# Patient Record
Sex: Male | Born: 1935 | Race: White | Hispanic: No | Marital: Married
Health system: Southern US, Community
[De-identification: ages and names within clinical notes are randomized; demographics above are authoritative.]

---

## 2003-09-14 ENCOUNTER — Other Ambulatory Visit: Payer: Self-pay

## 2013-09-11 ENCOUNTER — Ambulatory Visit: Payer: Self-pay | Admitting: Family Medicine

## 2013-09-11 LAB — CREATININE, SERUM
CREATININE: 1.45 mg/dL — AB (ref 0.60–1.30)
EGFR (Non-African Amer.): 45 — ABNORMAL LOW
GFR CALC AF AMER: 52 — AB

## 2013-09-13 ENCOUNTER — Ambulatory Visit: Payer: Self-pay | Admitting: Oncology

## 2013-09-19 ENCOUNTER — Ambulatory Visit: Payer: Self-pay | Admitting: Oncology

## 2013-09-19 LAB — PROTIME-INR
INR: 1.3
Prothrombin Time: 15.6 secs — ABNORMAL HIGH (ref 11.5–14.7)

## 2013-09-23 LAB — BODY FLUID CULTURE

## 2013-10-03 ENCOUNTER — Ambulatory Visit: Payer: Self-pay | Admitting: Oncology

## 2013-10-16 ENCOUNTER — Ambulatory Visit: Payer: Self-pay | Admitting: Oncology

## 2013-10-24 LAB — CBC CANCER CENTER
BASOS PCT: 0.4 %
Basophil #: 0.1 x10 3/mm (ref 0.0–0.1)
EOS ABS: 0.2 x10 3/mm (ref 0.0–0.7)
Eosinophil %: 1.7 %
HCT: 43.5 % (ref 40.0–52.0)
HGB: 13.8 g/dL (ref 13.0–18.0)
LYMPHS ABS: 1.9 x10 3/mm (ref 1.0–3.6)
Lymphocyte %: 14 %
MCH: 30.9 pg (ref 26.0–34.0)
MCHC: 31.7 g/dL — AB (ref 32.0–36.0)
MCV: 97 fL (ref 80–100)
MONO ABS: 1.2 x10 3/mm — AB (ref 0.2–1.0)
Monocyte %: 8.3 %
Neutrophil #: 10.5 x10 3/mm — ABNORMAL HIGH (ref 1.4–6.5)
Neutrophil %: 75.6 %
Platelet: 361 x10 3/mm (ref 150–440)
RBC: 4.46 10*6/uL (ref 4.40–5.90)
RDW: 16.1 % — ABNORMAL HIGH (ref 11.5–14.5)
WBC: 13.9 x10 3/mm — ABNORMAL HIGH (ref 3.8–10.6)

## 2013-10-24 LAB — COMPREHENSIVE METABOLIC PANEL
ALBUMIN: 2.3 g/dL — AB (ref 3.4–5.0)
ANION GAP: 11 (ref 7–16)
Alkaline Phosphatase: 325 U/L — ABNORMAL HIGH
BILIRUBIN TOTAL: 0.9 mg/dL (ref 0.2–1.0)
BUN: 31 mg/dL — AB (ref 7–18)
CO2: 24 mmol/L (ref 21–32)
CREATININE: 1.29 mg/dL (ref 0.60–1.30)
Calcium, Total: 8.9 mg/dL (ref 8.5–10.1)
Chloride: 101 mmol/L (ref 98–107)
EGFR (Non-African Amer.): 52 — ABNORMAL LOW
GFR CALC AF AMER: 60 — AB
Glucose: 126 mg/dL — ABNORMAL HIGH (ref 65–99)
Osmolality: 280 (ref 275–301)
POTASSIUM: 5.6 mmol/L — AB (ref 3.5–5.1)
SGOT(AST): 52 U/L — ABNORMAL HIGH (ref 15–37)
SGPT (ALT): 21 U/L (ref 12–78)
Sodium: 136 mmol/L (ref 136–145)
TOTAL PROTEIN: 7.3 g/dL (ref 6.4–8.2)

## 2013-10-24 LAB — PROTIME-INR
INR: 5.2
PROTHROMBIN TIME: 46.2 s — AB (ref 11.5–14.7)

## 2013-11-16 ENCOUNTER — Ambulatory Visit: Payer: Self-pay | Admitting: Oncology

## 2013-11-16 DEATH — deceased

## 2013-11-21 ENCOUNTER — Telehealth: Payer: Self-pay

## 2013-11-21 NOTE — Telephone Encounter (Signed)
Patient died @ Home per [facility]bit[facility] °

## 2015-12-11 IMAGING — US US GUIDE NEEDLE - US [PERSON_NAME]
1 series · 5 of 5 positions shown · non-contrast
Comparison: Chest CT 09/11/2013

CLINICAL DATA: Right lung lesion and right pleural effusion.
Shortness of breath.

EXAM:
ULTRASOUND GUIDED RIGHT THORACENTESIS

[Series 1: us guide needle - us (person_name) · 0.26mm/px · 5 of 5 slices shown]
[im 1/5]
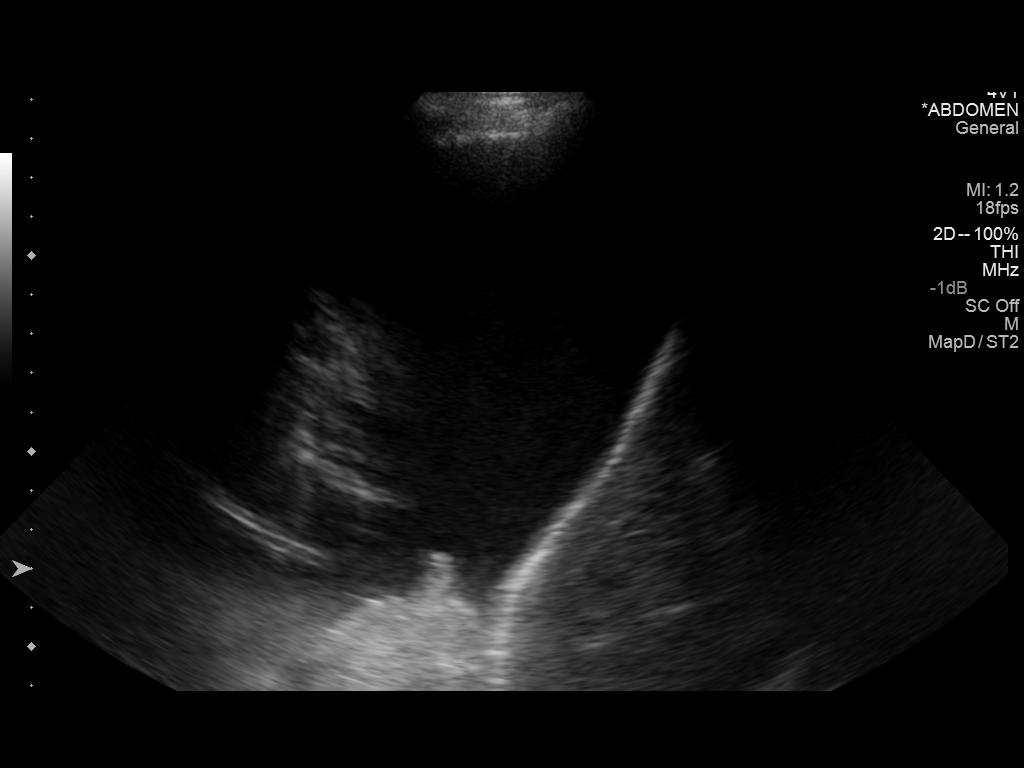
[im 2/5]
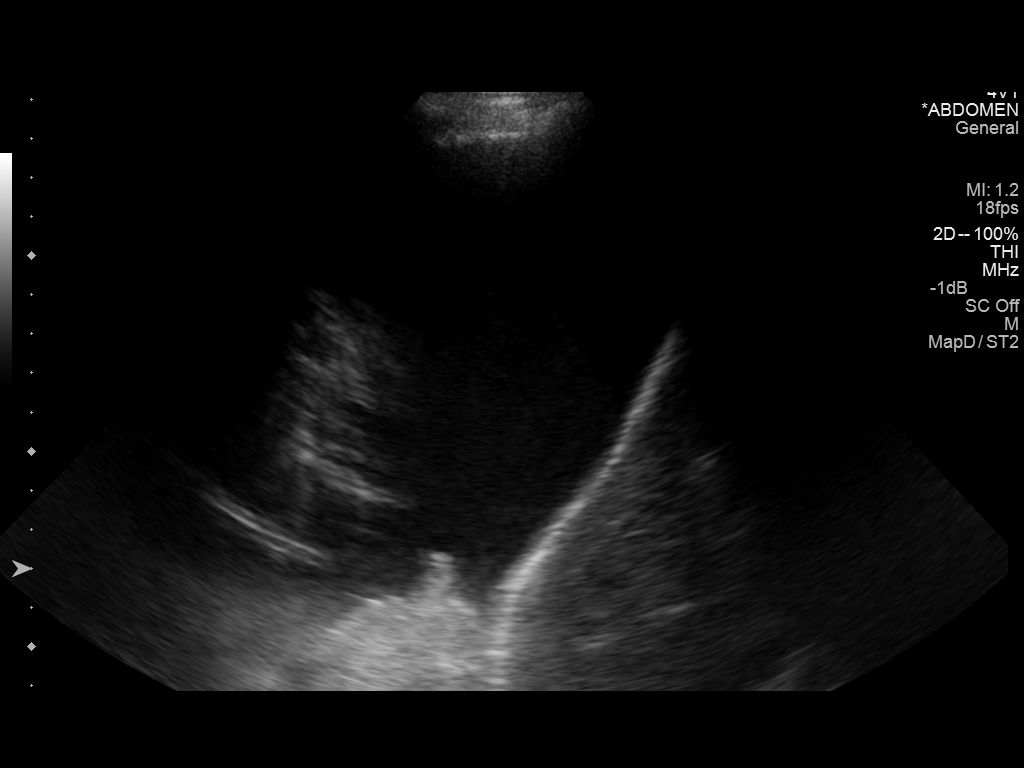
[im 3/5]
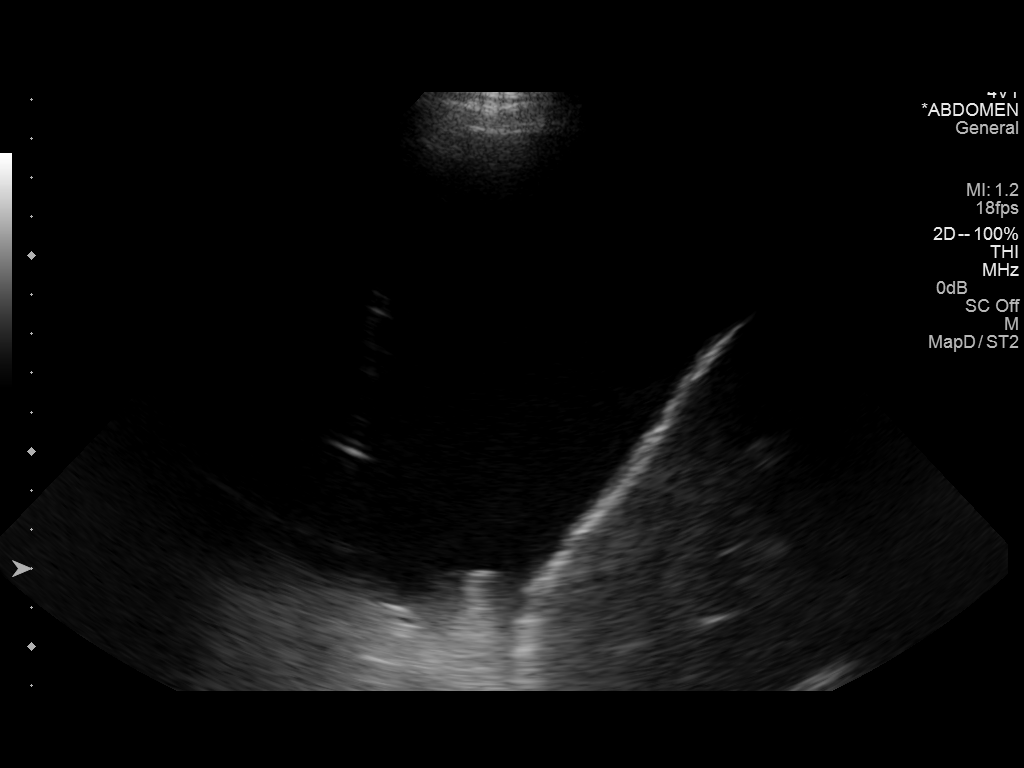
[im 4/5]
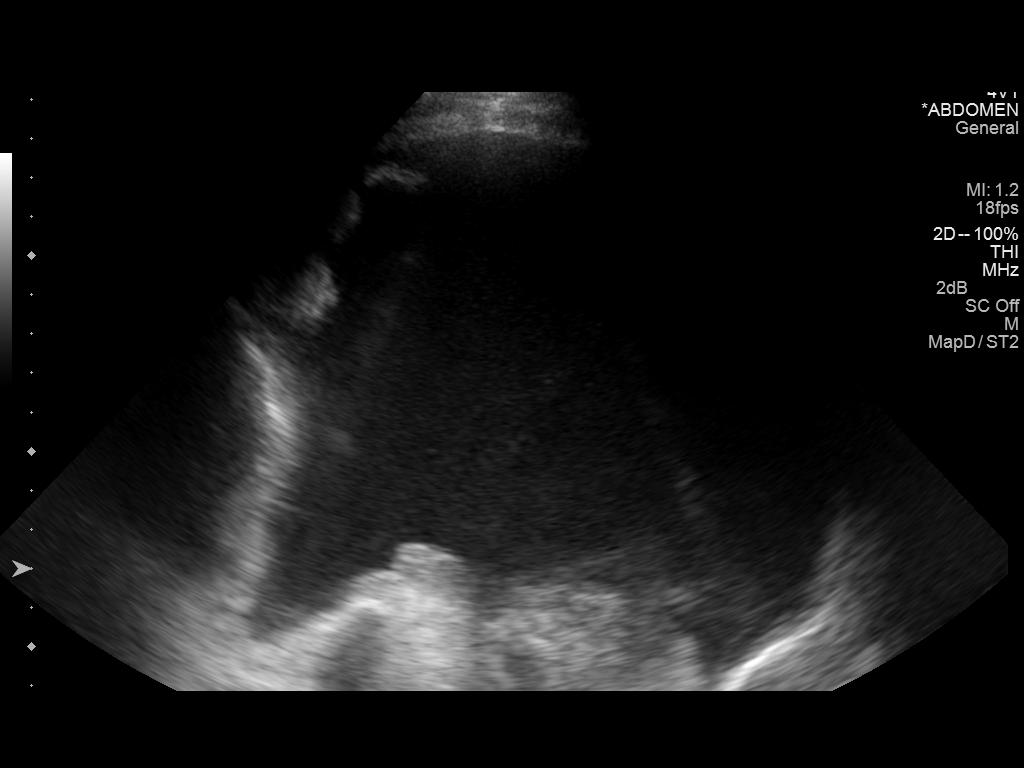
[im 5/5]
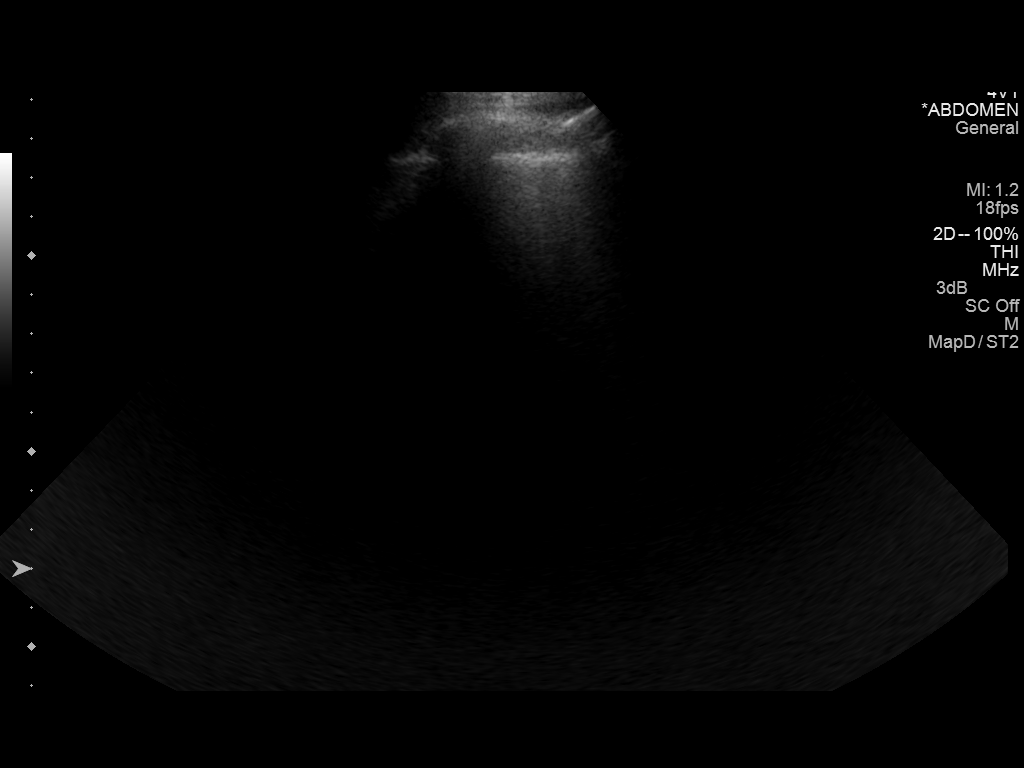

[5 of 5 positions shown; findings below may reference images not displayed]

PROCEDURE:
An ultrasound guided thoracentesis was thoroughly discussed with the
patient and questions answered. The benefits, risks, alternatives
and complications were also discussed. The patient understands and
wishes to proceed with the procedure. Written consent was obtained.

Ultrasound was performed to localize and mark an adequate pocket of
fluid in the right posterior chest. The area was then prepped and
draped in the normal sterile fashion. 1% Lidocaine was used for
local anesthesia. Under ultrasound guidance a Safe-T-Centesis
catheter was introduced. Thoracentesis was performed. The catheter
was removed and a dressing applied.

Complications:  None.
FINDINGS: A total of approximately 1.6 L of yellow fluid was removed.
IMPRESSION: Successful ultrasound guided right thoracentesis yielding 1.6 L of
pleural fluid.

## 2015-12-29 IMAGING — CR ORBITS FOR FOREIGN BODY - 2 VIEW
2 series · 2 of 2 positions shown · non-contrast
Comparison: None.

CLINICAL DATA: Metal working/exposure; clearance prior to MRI

EXAM:
ORBITS FOR FOREIGN BODY - 2 VIEW

[orbits waters (1 of 2)]
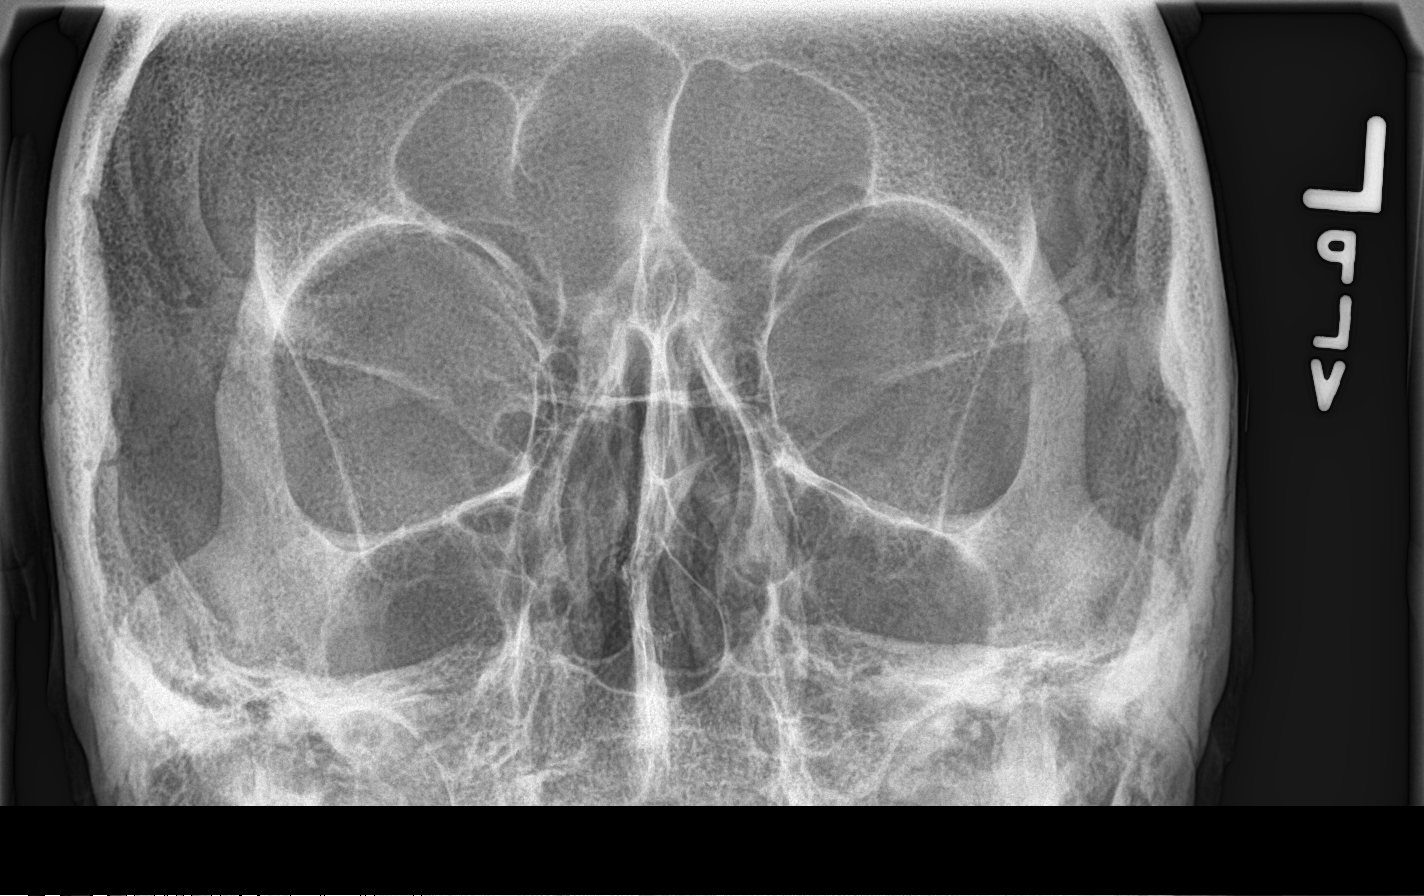

[orbits waters (2 of 2)]
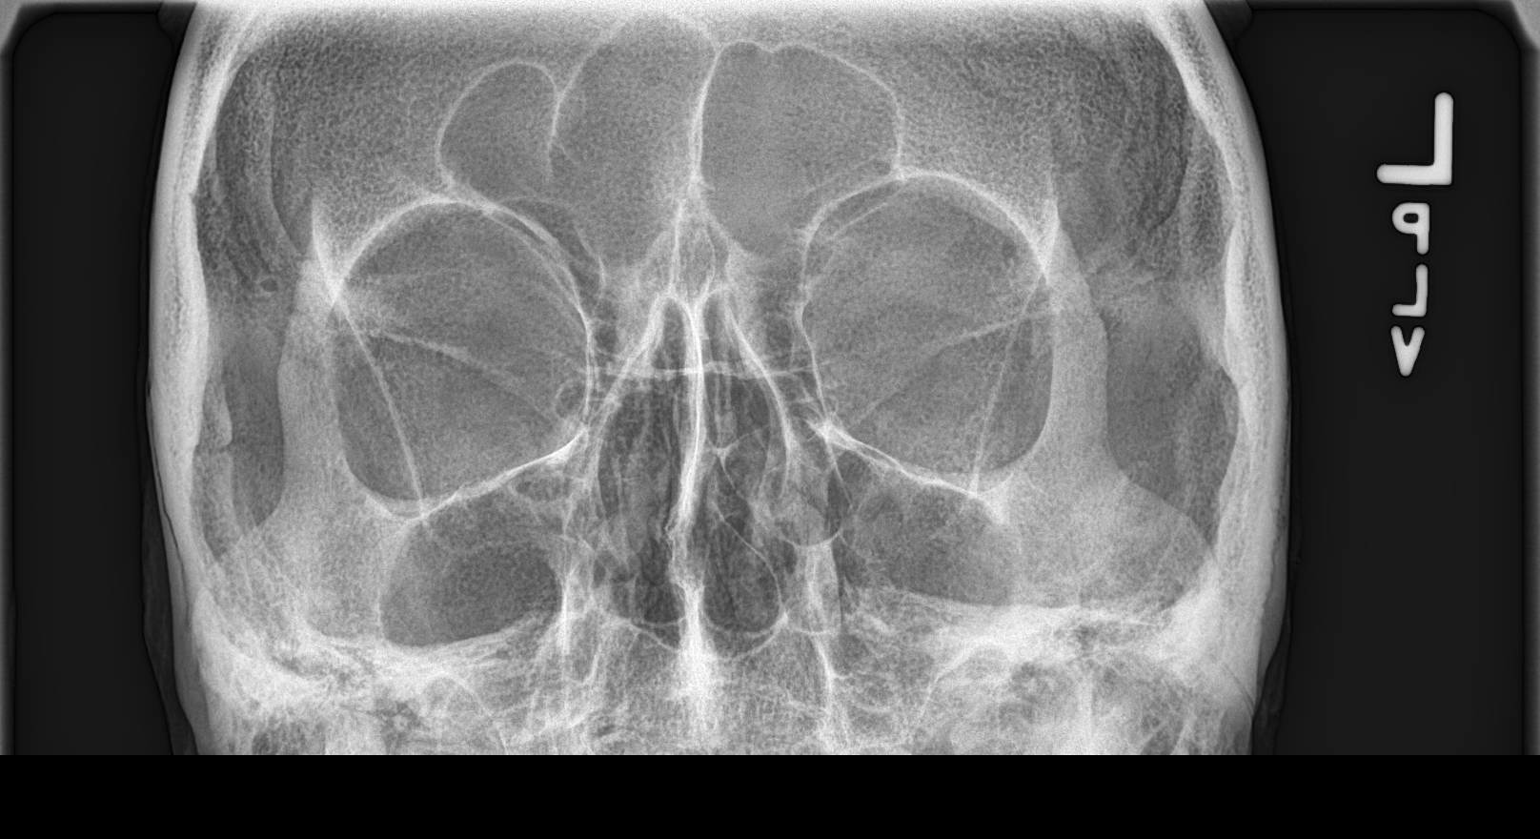

[2 of 2 positions shown; findings below may reference images not displayed]

FINDINGS: There is no evidence of metallic foreign body within the orbits. No
significant bone abnormality identified.
IMPRESSION: No evidence of metallic foreign body within the orbits.
